# Patient Record
Sex: Male | Born: 2005 | Race: Black or African American | Hispanic: No | Marital: Single | Smoking: Never smoker
Health system: Southern US, Community
[De-identification: ages and names within clinical notes are randomized; demographics above are authoritative.]

---
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
## 2024-05-07 ENCOUNTER — Encounter: Payer: Self-pay | Admitting: Nurse Practitioner

## 2024-05-07 NOTE — Progress Notes (Deleted)
 Name: Geoffrey Boyd   MRN: [id number]    DOB: Feb 18, 2006   Date:05/07/2024       Progress Note  Subjective  Chief Complaint  No chief complaint on file.   HPI  Patient presents for annual CPE ***. Discussed the use of AI scribe software for clinical note transcription with the patient, who gave verbal consent to proceed.  History of Present Illness       Diet: *** Exercise: *** Sleep: *** Last dental exam:*** Last eye exam: *** Waist Measurement : (not recorded)  Depression: phq 9 is {gen pos [id number]}    05/02/2023   10:55 AM  Depression screen PHQ 2/9  Decreased Interest 0  Down, Depressed, Hopeless 0  PHQ - 2 Score 0    Hypertension:  BP Readings from Last 3 Encounters:  05/02/23 110/78    Obesity: Wt Readings from Last 3 Encounters:  05/02/23 145 lb 8 oz (66 kg) (42%, Z= -0.20)*  11/26/17 105 lb 12.8 oz (48 kg) (59%, Z= 0.24)*   * Growth percentiles are based on CDC (Boys, 2-20 Years) data.   BMI Readings from Last 3 Encounters:  05/02/23 20.01 kg/m (20%, Z= -0.85)*   * Growth percentiles are based on CDC (Boys, 2-20 Years) data.     Lipids:  No results found for: CHOL No results found for: HDL No results found for: LDLCALC No results found for: TRIG No results found for: CHOLHDL No results found for: LDLDIRECT Glucose:  Glucose, Bld  Date Value Ref Range Status  05/02/2023 80 65 - 99 mg/dL Final    Comment:    .            Fasting reference interval .     Flowsheet Row Office Visit from 05/02/2023 in [facility]  AUDIT-C Score 1     Single STD testing and prevention (HIV/chl/gon/syphilis): completed Hep C: completed  Skin cancer: Discussed monitoring for atypical lesions Colorectal cancer: does not qualify Prostate cancer: does not qualify No results found for: PSA   Lung cancer:   Low Dose CT Chest recommended if Age 28-80 years, 30 pack-year currently smoking OR have quit w/in  15years. Patient does not qualify.   AAA:  The USPSTF recommends one-time screening with ultrasonography in men ages 84 to 83 years who have ever smoked ECG:  none  Vaccines:  HPV: up to at age 32 , ask insurance if age between 44-45  Shingrix: 28-64 yo and ask insurance if covered when patient above 48 yo Pneumonia:  educated and discussed with patient. Flu:  educated and discussed with patient.  Advanced Care Planning: A voluntary discussion about advance care planning including the explanation and discussion of advance directives.  Discussed health care proxy and Living will, and the patient was able to identify a health care proxy as ***.  Patient {DOES_DOES [id number]:[id number]} have a living will at present time. If patient does have living will, I have requested they bring this to the clinic to be scanned in to their chart.  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  Family History  Problem Relation Age of Onset   Hypertension Father     Social History   Socioeconomic History   Marital status: Single    Spouse name: Not on file   Number of children: Not on file   Years of education: Not on file  Highest education level: Not on file  Occupational History   Not on file  Tobacco Use   Smoking status: Never    Passive exposure: Never   Smokeless tobacco: Never  Vaping Use   Vaping status: Never Used  Substance and Sexual Activity   Alcohol use: Yes   Drug use: Never   Sexual activity: Yes    Birth control/protection: Condom  Other Topics Concern   Not on file  Social History Narrative   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  Allergies  Allergen Reactions   Other     Tree nuts   Penicillins    Sulfa Antibiotics    Tree Extract Itching    Walnuts, pecans, cashews     ROS  Constitutional: Negative for fever or weight change.  Respiratory: Negative for cough and shortness of breath.   Cardiovascular: Negative for chest pain or palpitations.  Gastrointestinal: Negative for abdominal pain, no bowel changes.  Musculoskeletal: Negative for gait problem or joint swelling.  Skin: Negative for rash.  Neurological: Negative for dizziness or headache.  No other specific complaints in a complete review of systems (except as listed in HPI above).    Objective  There were no vitals filed for this visit.  There is no height or weight on file to calculate BMI.  Physical Exam Vitals reviewed.  Constitutional:      Appearance: Normal appearance.  HENT:     Head: Normocephalic.     Right Ear: Tympanic membrane normal.     Left Ear: Tympanic membrane normal.     Nose: Nose normal.  Eyes:     Extraocular Movements: Extraocular movements intact.     Conjunctiva/sclera: Conjunctivae normal.     Pupils: Pupils are equal, round, and reactive to light.  Neck:     Thyroid: No thyroid mass, thyromegaly or thyroid tenderness.  Cardiovascular:     Rate and Rhythm: Normal rate and regular rhythm.     Pulses: Normal pulses.     Heart sounds: Normal heart sounds.  Pulmonary:     Effort: Pulmonary effort is normal.     Breath sounds: Normal breath sounds.  Abdominal:     General: Bowel sounds are normal.     Palpations: Abdomen is soft.  Musculoskeletal:        General: Normal range of motion.     Cervical back: Normal range of motion and neck supple.     Right lower leg: No edema.     Left lower leg: No edema.  Skin:    General: Skin is warm and dry.     Capillary Refill: Capillary refill takes less than 2 seconds.  Neurological:     General: No focal  deficit present.     Mental Status: He is alert and oriented to person, place, and time. Mental status is at baseline.  Psychiatric:        Mood and Affect: Mood normal.        Behavior: Behavior normal.        Thought Content: Thought content normal.        Judgment: Judgment normal.  No results found for this or any previous visit (from the past 2160 hours).   Fall Risk:    05/02/2023   10:55 AM  Fall Risk   Falls in the past year? 0  Number falls in past yr: 0  Injury with Fall? 0      Functional Status Survey:      Assessment & Plan  There are no diagnoses linked to this encounter.   -Prostate cancer screening and PSA options (with potential risks and benefits of testing vs not testing) were discussed along with recent recs/guidelines. -USPSTF grade A and B recommendations reviewed with patient; age-appropriate recommendations, preventive care, screening tests, etc discussed and encouraged; healthy living encouraged; see AVS for patient education given to patient -Discussed importance of 150 minutes of physical activity weekly, eat two servings of fish weekly, eat one serving of tree nuts ( cashews, pistachios, pecans, almonds.SABRA) every other day, eat 6 servings of fruit/vegetables daily and drink plenty of water and avoid sweet beverages.  -Reviewed Health Maintenance: yes

## 2024-05-14 ENCOUNTER — Ambulatory Visit: Admitting: Family Medicine

## 2024-05-17 ENCOUNTER — Other Ambulatory Visit (HOSPITAL_COMMUNITY)
Admission: RE | Admit: 2024-05-17 | Discharge: 2024-05-17 | Disposition: A | Discharge status: Home / Self Care | Source: Ambulatory Visit | Attending: Nurse Practitioner | Admitting: Nurse Practitioner

## 2024-05-17 ENCOUNTER — Encounter: Payer: Self-pay | Admitting: Nurse Practitioner

## 2024-05-17 ENCOUNTER — Ambulatory Visit (INDEPENDENT_AMBULATORY_CARE_PROVIDER_SITE_OTHER): Admitting: Nurse Practitioner

## 2024-05-17 VITALS — BP 118/72 | HR 67 | Resp 16 | Ht 71.0 in | Wt 152.8 lb

## 2024-05-17 DIAGNOSIS — Z13220 Encounter for screening for lipoid disorders: Secondary | ICD-10-CM

## 2024-05-17 DIAGNOSIS — Z0000 Encounter for general adult medical examination without abnormal findings: Secondary | ICD-10-CM | POA: Insufficient documentation

## 2024-05-17 DIAGNOSIS — Z113 Encounter for screening for infections with a predominantly sexual mode of transmission: Secondary | ICD-10-CM | POA: Diagnosis not present

## 2024-05-17 DIAGNOSIS — Z131 Encounter for screening for diabetes mellitus: Secondary | ICD-10-CM

## 2024-05-17 DIAGNOSIS — Z130 Encounter for screening for diseases of the blood and blood-forming organs and certain disorders involving the immune mechanism: Secondary | ICD-10-CM

## 2024-05-17 NOTE — Progress Notes (Signed)
 Name: Geoffrey Boyd   MRN: [id number]    DOB: November 28, 2005   Date:05/17/2024       Progress Note  Subjective  Chief Complaint  Chief Complaint  Patient presents with   Annual Exam    HPI  Patient presents for annual CPE . Discussed the use of AI scribe software for clinical note transcription with the patient, who gave verbal consent to proceed.  History of Present Illness Geoffrey Boyd is a 18 year old male who presents for an annual physical exam.  General health maintenance - No new health issues over the past year - Maintains a well-balanced diet - Engages in daily exercise - Typically sleeps eight to nine hours per night  Dental and ophthalmologic health - Dental exam performed approximately one month ago without issues - No visual complaints  Sexual health - Sexually active      Diet: well balanced diet Exercise: works out daily Sleep: 9 hours Last dental exam:month ago Last eye exam: no issues  Depression: phq 9 is negative    05/17/2024   12:53 PM 05/02/2023   10:55 AM  Depression screen PHQ 2/9  Decreased Interest 0 0  Down, Depressed, Hopeless 0 0  PHQ - 2 Score 0 0    Hypertension:  BP Readings from Last 3 Encounters:  05/17/24 118/72  05/02/23 110/78    Obesity: Wt Readings from Last 3 Encounters:  05/17/24 152 lb 12.8 oz (69.3 kg) (48%, Z= -0.06)*  05/02/23 145 lb 8 oz (66 kg) (42%, Z= -0.20)*  11/26/17 105 lb 12.8 oz (48 kg) (59%, Z= 0.24)*   * Growth percentiles are based on CDC (Boys, 2-20 Years) data.   BMI Readings from Last 3 Encounters:  05/17/24 21.31 kg/m (30%, Z= -0.52)*  05/02/23 20.01 kg/m (20%, Z= -0.85)*   * Growth percentiles are based on CDC (Boys, 2-20 Years) data.     Lipids:  No results found for: CHOL No results found for: HDL No results found for: LDLCALC No results found for: TRIG No results found for: CHOLHDL No results found for: LDLDIRECT Glucose:  Glucose, Bld  Date Value Ref Range Status   05/02/2023 80 65 - 99 mg/dL Final    Comment:    .            Fasting reference interval .     Flowsheet Row [facility]17/[facility]  AUDIT-C Score 2      Single STD testing and prevention (HIV/chl/gon/syphilis): completed Hep C: completed  Skin cancer: Discussed monitoring for atypical lesions Colorectal cancer: does not qualify Prostate cancer: does not qualify No results found for: PSA   Lung cancer:   Low Dose CT Chest recommended if Age 26-80 years, 30 pack-year currently smoking OR have quit w/in 15years. Patient does not qualify.   AAA:  The USPSTF recommends one-time screening with ultrasonography in men ages 80 to 79 years who have ever smoked ECG:  none  Vaccines:  HPV: up to at age 84 , ask insurance if age between 79-45  Shingrix: 15-64 yo and ask insurance if covered when patient above 16 yo Pneumonia:  educated and discussed with patient. Flu:  educated and discussed with patient.  Advanced Care Planning: A voluntary discussion about advance care planning including the explanation and discussion of advance directives.  Discussed health care proxy and Living will, and the patient was able to identify a health care proxy as mom.  Patient does  not have a living will at present time. If patient does have living will, I have requested they bring this to the clinic to be scanned in to their chart.  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 No pertinent surgical history.  Family History  Problem Relation Age of Onset   Hypertension Father     Social History   Socioeconomic History   Marital status: Single    Spouse name: Not on file   Number of children: Not on file   Years of education: Not on file   Highest education level: 12th grade  Occupational History   Not on file  Tobacco Use   Smoking status: Never    Passive exposure: Never    Smokeless tobacco: Never   Tobacco comments:    never done  Vaping Use   Vaping status: Never Used  Substance and Sexual Activity   Alcohol use: Yes    Alcohol/week: 2.0 standard drinks of alcohol    Types: 2 Shots of liquor per week   Drug use: Never   Sexual activity: Yes    Birth control/protection: Condom  Other Topics Concern   Not on file  Social History Narrative   Freshman at [employer] studying Business   Social Drivers of Health   Financial Resource Strain: Low Risk  (05/16/2024)   Overall Financial Resource Strain (CARDIA)    Difficulty of Paying Living Expenses: Not hard at all  Food Insecurity: No Food Insecurity (05/16/2024)   Hunger Vital Sign    Worried About Running Out of Food in the Last Year: Never true    Ran Out of Food in the Last Year: Never true  Transportation Needs: No Transportation Needs (05/16/2024)   PRAPARE - Administrator, Civil Service (Medical): No    Lack of Transportation (Non-Medical): No  Physical Activity: Sufficiently Active (05/16/2024)   Exercise Vital Sign    Days of Exercise per Week: 5 days    Minutes of Exercise per Session: 30 min  Stress: No Stress Concern Present (05/16/2024)   [employer] of Occupational Health - Occupational Stress Questionnaire    Feeling of Stress: Not at all  Social Connections: Moderately Integrated (05/16/2024)   Social Connection and Isolation Panel    Frequency of Communication with Friends and Family: More than three times a week    Frequency of Social Gatherings with Friends and Family: More than three times a week    Attends Religious Services: 1 to 4 times per year    Active Member of [employer] or Organizations: Yes    Attends Banker Meetings: More than 4 times per year    Marital Status: Never married  Intimate Partner Violence: Not At Risk (05/17/2024)   Humiliation, Afraid, Rape, and Kick questionnaire    Fear of Current or Ex-Partner: No    Emotionally Abused: No     Physically Abused: No    Sexually Abused: No    No current outpatient medications on file.  Allergies  Allergen Reactions   Other     Tree nuts   Penicillins    Sulfa Antibiotics    Tree Extract Itching    Walnuts, pecans, cashews     ROS  Constitutional: Negative for fever or weight change.  Respiratory: Negative for cough and shortness of breath.   Cardiovascular: Negative for chest pain or palpitations.  Gastrointestinal: Negative for abdominal pain, no  bowel changes.  Musculoskeletal: Negative for gait problem or joint swelling.  Skin: Negative for rash.  Neurological: Negative for dizziness or headache.  No other specific complaints in a complete review of systems (except as listed in HPI above).    Objective  Vitals:   05/17/24 1254  BP: 118/72  Pulse: 67  Resp: 16  SpO2: 99%  Weight: 152 lb 12.8 oz (69.3 kg)  Height: 5' 11 (1.803 m)    Body mass index is 21.31 kg/m.  Physical Exam Vitals reviewed.  Constitutional:      Appearance: Normal appearance.  HENT:     Head: Normocephalic.     Right Ear: Tympanic membrane normal.     Left Ear: Tympanic membrane normal.     Nose: Nose normal.  Eyes:     Extraocular Movements: Extraocular movements intact.     Conjunctiva/sclera: Conjunctivae normal.     Pupils: Pupils are equal, round, and reactive to light.  Neck:     Thyroid: No thyroid mass, thyromegaly or thyroid tenderness.  Cardiovascular:     Rate and Rhythm: Normal rate and regular rhythm.     Pulses: Normal pulses.     Heart sounds: Normal heart sounds.  Pulmonary:     Effort: Pulmonary effort is normal.     Breath sounds: Normal breath sounds.  Abdominal:     General: Bowel sounds are normal.     Palpations: Abdomen is soft.  Musculoskeletal:        General: Normal range of motion.     Cervical back: Normal range of motion and neck supple.     Right lower leg: No edema.     Left lower leg: No edema.  Skin:    General: Skin is warm and  dry.     Capillary Refill: Capillary refill takes less than 2 seconds.  Neurological:     General: No focal deficit present.     Mental Status: He is alert and oriented to person, place, and time. Mental status is at baseline.  Psychiatric:        Mood and Affect: Mood normal.        Behavior: Behavior normal.        Thought Content: Thought content normal.        Judgment: Judgment normal.      No results found for this or any previous visit (from the past 2160 hours).   Fall Risk:    05/17/2024   12:53 PM 05/02/2023   10:55 AM  Fall Risk   Falls in the past year? 0 0  Number falls in past yr: 0 0  Injury with Fall? 0 0  Risk for fall due to : No Fall Risks   Follow up Falls evaluation completed       Functional Status Survey: Is the patient deaf or have difficulty hearing?: No Does the patient have difficulty seeing, even when wearing glasses/contacts?: No Does the patient have difficulty concentrating, remembering, or making decisions?: No Does the patient have difficulty walking or climbing stairs?: No Does the patient have difficulty dressing or bathing?: No Does the patient have difficulty doing errands alone such as visiting a doctor's office or shopping?: No    Assessment & Plan  Problem List Items Addressed This Visit   None Visit Diagnoses       Annual physical exam    -  Primary   Relevant Orders   CBC with Differential/Platelet   Comprehensive metabolic panel with GFR  Lipid panel   Hemoglobin A1c   Hepatitis C antibody   HIV Antibody (routine testing w rflx)   RPR   Urine cytology ancillary only     Screening for deficiency anemia       Relevant Orders   CBC with Differential/Platelet     Screening for cholesterol level       Relevant Orders   Lipid panel     Screening for diabetes mellitus       Relevant Orders   Comprehensive metabolic panel with GFR   Hemoglobin A1c     Screening for STDs (sexually transmitted diseases)       Relevant  Orders   Hepatitis C antibody   HIV Antibody (routine testing w rflx)   RPR   Urine cytology ancillary only      Assessment and Plan Assessment & Plan Adult Wellness Visit 18 year old male with a well-balanced diet, regular exercise, and adequate sleep. Recent dental exam was normal. No vision issues. Depression screening negative. Blood pressure 118/72 mmHg, indicating normotension. Sexually active and agreed to routine sexual health screening. Advance directives discussed; mother identified as Education officer, environmental. No living will. - Perform routine sexual health screening.     -Prostate cancer screening and PSA options (with potential risks and benefits of testing vs not testing) were discussed along with recent recs/guidelines. -USPSTF grade A and B recommendations reviewed with patient; age-appropriate recommendations, preventive care, screening tests, etc discussed and encouraged; healthy living encouraged; see AVS for patient education given to patient -Discussed importance of 150 minutes of physical activity weekly, eat two servings of fish weekly, eat one serving of tree nuts ( cashews, pistachios, pecans, almonds.[email or web address]) every other day, eat 6 servings of fruit/vegetables daily and drink plenty of water and avoid sweet beverages.  -Reviewed Health Maintenance: yes

## 2024-05-18 LAB — COMPREHENSIVE METABOLIC PANEL WITH GFR
AG Ratio: 1.8 (calc) (ref 1.0–2.5)
ALT: 8 U/L (ref 8–46)
AST: 13 U/L (ref 12–32)
Albumin: 4.7 g/dL (ref 3.6–5.1)
Alkaline phosphatase (APISO): 54 U/L (ref 46–169)
BUN: 11 mg/dL (ref 7–20)
CO2: 30 mmol/L (ref 20–32)
Calcium: 9.9 mg/dL (ref 8.9–10.4)
Chloride: 104 mmol/L (ref 98–110)
Creat: 1.17 mg/dL (ref 0.60–1.24)
Globulin: 2.6 g/dL (ref 2.1–3.5)
Glucose, Bld: 77 mg/dL (ref 65–99)
Potassium: 4.1 mmol/L (ref 3.8–5.1)
Sodium: 141 mmol/L (ref 135–146)
Total Bilirubin: 1.4 mg/dL — ABNORMAL HIGH (ref 0.2–1.1)
Total Protein: 7.3 g/dL (ref 6.3–8.2)
eGFR: 92 mL/min/1.73m2 (ref >=60)

## 2024-05-18 LAB — CBC WITH DIFFERENTIAL/PLATELET
Absolute Lymphocytes: 1843 {cells}/uL (ref 850–3900)
Absolute Monocytes: 422 {cells}/uL (ref 200–950)
Basophils Absolute: 38 {cells}/uL (ref 0–200)
Basophils Relative: 0.6 %
Eosinophils Absolute: 301 {cells}/uL (ref 15–500)
Eosinophils Relative: 4.7 %
HCT: 47.8 % (ref 38.5–50.0)
Hemoglobin: 15.3 g/dL (ref 13.2–17.1)
MCH: 30 pg (ref 27.0–33.0)
MCHC: 32 g/dL (ref 32.0–36.0)
MCV: 93.7 fL (ref 80.0–100.0)
MPV: 11.4 fL (ref 7.5–12.5)
Monocytes Relative: 6.6 %
Neutro Abs: 3795 {cells}/uL (ref 1500–7800)
Neutrophils Relative %: 59.3 %
Platelets: 232 Thousand/uL (ref 140–400)
RBC: 5.1 Million/uL (ref 4.20–5.80)
RDW: 12.4 % (ref 11.0–15.0)
Total Lymphocyte: 28.8 %
WBC: 6.4 Thousand/uL (ref 3.8–10.8)

## 2024-05-18 LAB — LIPID PANEL
Cholesterol: 137 mg/dL (ref <170)
HDL: 40 mg/dL — ABNORMAL LOW (ref >45)
LDL Cholesterol (Calc): 78 mg/dL (ref <110)
Non-HDL Cholesterol (Calc): 97 mg/dL (ref <120)
Total CHOL/HDL Ratio: 3.4 (calc) (ref <5.0)
Triglycerides: 108 mg/dL — ABNORMAL HIGH (ref <90)

## 2024-05-18 LAB — HEMOGLOBIN A1C
Hgb A1c MFr Bld: 5 % (ref <5.7)
Mean Plasma Glucose: 97 mg/dL
eAG (mmol/L): 5.4 mmol/L

## 2024-05-18 LAB — HIV ANTIBODY (ROUTINE TESTING W REFLEX): HIV 1&2 Ab, 4th Generation: NONREACTIVE

## 2024-05-18 LAB — HEPATITIS C ANTIBODY: Hepatitis C Ab: NONREACTIVE

## 2024-05-18 LAB — RPR: RPR Ser Ql: NONREACTIVE

## 2024-05-20 ENCOUNTER — Ambulatory Visit: Payer: Self-pay | Admitting: Nurse Practitioner

## 2024-05-20 LAB — URINE CYTOLOGY ANCILLARY ONLY
Chlamydia: NEGATIVE
Comment: NEGATIVE
Comment: NORMAL
Neisseria Gonorrhea: NEGATIVE

## 2025-05-22 ENCOUNTER — Encounter: Admitting: Nurse Practitioner
